# Patient Record
Sex: Male | Born: 1950
Health system: Southern US, Community
[De-identification: ages and names within clinical notes are randomized; demographics above are authoritative.]

## PROBLEM LIST (undated history)

## (undated) DIAGNOSIS — J343 Hypertrophy of nasal turbinates: Secondary | ICD-10-CM

## (undated) DIAGNOSIS — J302 Other seasonal allergic rhinitis: Secondary | ICD-10-CM

## (undated) DIAGNOSIS — N401 Enlarged prostate with lower urinary tract symptoms: Secondary | ICD-10-CM

## (undated) HISTORY — PX: NO PAST SURGERIES: SHX2092

---

## 1997-05-07 ENCOUNTER — Encounter: Admission: RE | Admit: 1997-05-07 | Discharge: 1997-05-22 | Payer: Self-pay | Admitting: Internal Medicine

## 2011-08-15 ENCOUNTER — Other Ambulatory Visit: Payer: Self-pay | Admitting: Family Medicine

## 2011-08-15 ENCOUNTER — Ambulatory Visit
Admission: RE | Admit: 2011-08-15 | Discharge: 2011-08-15 | Disposition: A | Discharge status: Home / Self Care | Payer: Managed Care, Other (non HMO) | Source: Ambulatory Visit | Attending: Family Medicine | Admitting: Family Medicine

## 2011-08-15 DIAGNOSIS — R0981 Nasal congestion: Secondary | ICD-10-CM

## 2017-09-15 DIAGNOSIS — N401 Enlarged prostate with lower urinary tract symptoms: Secondary | ICD-10-CM | POA: Diagnosis not present

## 2017-09-15 DIAGNOSIS — Z6825 Body mass index (BMI) 25.0-25.9, adult: Secondary | ICD-10-CM | POA: Diagnosis not present

## 2017-09-15 DIAGNOSIS — Z1389 Encounter for screening for other disorder: Secondary | ICD-10-CM | POA: Diagnosis not present

## 2017-09-15 DIAGNOSIS — J3489 Other specified disorders of nose and nasal sinuses: Secondary | ICD-10-CM | POA: Diagnosis not present

## 2017-10-28 DIAGNOSIS — Z23 Encounter for immunization: Secondary | ICD-10-CM | POA: Diagnosis not present

## 2018-02-28 DIAGNOSIS — J309 Allergic rhinitis, unspecified: Secondary | ICD-10-CM | POA: Diagnosis not present

## 2018-02-28 DIAGNOSIS — J3089 Other allergic rhinitis: Secondary | ICD-10-CM | POA: Diagnosis not present

## 2018-02-28 DIAGNOSIS — J339 Nasal polyp, unspecified: Secondary | ICD-10-CM | POA: Diagnosis not present

## 2018-02-28 DIAGNOSIS — J301 Allergic rhinitis due to pollen: Secondary | ICD-10-CM | POA: Diagnosis not present

## 2018-05-10 DIAGNOSIS — N401 Enlarged prostate with lower urinary tract symptoms: Secondary | ICD-10-CM | POA: Diagnosis not present

## 2018-05-10 DIAGNOSIS — Z23 Encounter for immunization: Secondary | ICD-10-CM | POA: Diagnosis not present

## 2018-05-10 DIAGNOSIS — M6530 Trigger finger, unspecified finger: Secondary | ICD-10-CM | POA: Diagnosis not present

## 2018-05-10 DIAGNOSIS — Z6826 Body mass index (BMI) 26.0-26.9, adult: Secondary | ICD-10-CM | POA: Diagnosis not present

## 2018-05-10 DIAGNOSIS — Z79899 Other long term (current) drug therapy: Secondary | ICD-10-CM | POA: Diagnosis not present

## 2018-05-10 DIAGNOSIS — Z0000 Encounter for general adult medical examination without abnormal findings: Secondary | ICD-10-CM | POA: Diagnosis not present

## 2018-05-10 DIAGNOSIS — R6882 Decreased libido: Secondary | ICD-10-CM | POA: Diagnosis not present

## 2018-05-10 DIAGNOSIS — M1990 Unspecified osteoarthritis, unspecified site: Secondary | ICD-10-CM | POA: Diagnosis not present

## 2018-07-04 DIAGNOSIS — J309 Allergic rhinitis, unspecified: Secondary | ICD-10-CM | POA: Diagnosis not present

## 2018-07-04 DIAGNOSIS — J3089 Other allergic rhinitis: Secondary | ICD-10-CM | POA: Diagnosis not present

## 2018-07-04 DIAGNOSIS — J301 Allergic rhinitis due to pollen: Secondary | ICD-10-CM | POA: Diagnosis not present

## 2018-07-04 DIAGNOSIS — J339 Nasal polyp, unspecified: Secondary | ICD-10-CM | POA: Diagnosis not present

## 2018-09-25 DIAGNOSIS — J310 Chronic rhinitis: Secondary | ICD-10-CM | POA: Diagnosis not present

## 2018-09-25 DIAGNOSIS — J342 Deviated nasal septum: Secondary | ICD-10-CM | POA: Diagnosis not present

## 2018-09-25 DIAGNOSIS — J343 Hypertrophy of nasal turbinates: Secondary | ICD-10-CM | POA: Diagnosis not present

## 2018-09-25 DIAGNOSIS — J338 Other polyp of sinus: Secondary | ICD-10-CM | POA: Diagnosis not present

## 2018-10-05 ENCOUNTER — Other Ambulatory Visit: Payer: Self-pay | Admitting: Otolaryngology

## 2018-10-05 DIAGNOSIS — J330 Polyp of nasal cavity: Secondary | ICD-10-CM

## 2018-10-31 ENCOUNTER — Other Ambulatory Visit: Payer: Self-pay | Admitting: Otolaryngology

## 2018-11-02 ENCOUNTER — Ambulatory Visit
Admission: RE | Admit: 2018-11-02 | Discharge: 2018-11-02 | Disposition: A | Discharge status: Home / Self Care | Payer: Managed Care, Other (non HMO) | Source: Ambulatory Visit | Attending: Otolaryngology | Admitting: Otolaryngology

## 2018-11-02 DIAGNOSIS — J330 Polyp of nasal cavity: Secondary | ICD-10-CM

## 2018-11-02 DIAGNOSIS — J329 Chronic sinusitis, unspecified: Secondary | ICD-10-CM | POA: Diagnosis not present

## 2018-11-07 DIAGNOSIS — J338 Other polyp of sinus: Secondary | ICD-10-CM | POA: Diagnosis not present

## 2018-11-07 DIAGNOSIS — J343 Hypertrophy of nasal turbinates: Secondary | ICD-10-CM | POA: Diagnosis not present

## 2018-11-07 DIAGNOSIS — J321 Chronic frontal sinusitis: Secondary | ICD-10-CM | POA: Diagnosis not present

## 2018-11-07 DIAGNOSIS — J322 Chronic ethmoidal sinusitis: Secondary | ICD-10-CM | POA: Diagnosis not present

## 2018-11-17 DIAGNOSIS — Z23 Encounter for immunization: Secondary | ICD-10-CM | POA: Diagnosis not present

## 2018-12-05 ENCOUNTER — Other Ambulatory Visit: Payer: Self-pay | Admitting: Otolaryngology

## 2018-12-21 ENCOUNTER — Encounter (HOSPITAL_BASED_OUTPATIENT_CLINIC_OR_DEPARTMENT_OTHER): Payer: Self-pay | Admitting: *Deleted

## 2018-12-21 ENCOUNTER — Other Ambulatory Visit: Payer: Self-pay

## 2018-12-25 ENCOUNTER — Other Ambulatory Visit (HOSPITAL_COMMUNITY)
Admission: RE | Admit: 2018-12-25 | Discharge: 2018-12-25 | Disposition: A | Discharge status: Home / Self Care | Payer: Medicare HMO | Source: Ambulatory Visit | Attending: Otolaryngology | Admitting: Otolaryngology

## 2018-12-25 DIAGNOSIS — Z01812 Encounter for preprocedural laboratory examination: Secondary | ICD-10-CM | POA: Insufficient documentation

## 2018-12-25 DIAGNOSIS — Z20828 Contact with and (suspected) exposure to other viral communicable diseases: Secondary | ICD-10-CM | POA: Insufficient documentation

## 2018-12-27 LAB — NOVEL CORONAVIRUS, NAA (HOSP ORDER, SEND-OUT TO REF LAB; TAT 18-24 HRS): SARS-CoV-2, NAA: NOT DETECTED

## 2018-12-28 ENCOUNTER — Ambulatory Visit (HOSPITAL_BASED_OUTPATIENT_CLINIC_OR_DEPARTMENT_OTHER): Payer: Medicare HMO | Admitting: Anesthesiology

## 2018-12-28 ENCOUNTER — Ambulatory Visit (HOSPITAL_BASED_OUTPATIENT_CLINIC_OR_DEPARTMENT_OTHER)
Admission: RE | Admit: 2018-12-28 | Discharge: 2018-12-28 | Disposition: A | Discharge status: Home / Self Care | Payer: Medicare HMO | Attending: Otolaryngology | Admitting: Otolaryngology

## 2018-12-28 ENCOUNTER — Encounter (HOSPITAL_BASED_OUTPATIENT_CLINIC_OR_DEPARTMENT_OTHER)
Admission: RE | Disposition: A | Discharge status: Home / Self Care | Payer: Self-pay | Source: Home / Self Care | Attending: Otolaryngology

## 2018-12-28 ENCOUNTER — Encounter (HOSPITAL_BASED_OUTPATIENT_CLINIC_OR_DEPARTMENT_OTHER): Payer: Self-pay | Admitting: Anesthesiology

## 2018-12-28 ENCOUNTER — Other Ambulatory Visit: Payer: Self-pay

## 2018-12-28 DIAGNOSIS — J3489 Other specified disorders of nose and nasal sinuses: Secondary | ICD-10-CM | POA: Diagnosis not present

## 2018-12-28 DIAGNOSIS — J343 Hypertrophy of nasal turbinates: Secondary | ICD-10-CM | POA: Diagnosis not present

## 2018-12-28 DIAGNOSIS — J320 Chronic maxillary sinusitis: Secondary | ICD-10-CM | POA: Insufficient documentation

## 2018-12-28 DIAGNOSIS — J322 Chronic ethmoidal sinusitis: Secondary | ICD-10-CM | POA: Diagnosis not present

## 2018-12-28 DIAGNOSIS — J321 Chronic frontal sinusitis: Secondary | ICD-10-CM | POA: Diagnosis not present

## 2018-12-28 DIAGNOSIS — J338 Other polyp of sinus: Secondary | ICD-10-CM | POA: Insufficient documentation

## 2018-12-28 DIAGNOSIS — J328 Other chronic sinusitis: Secondary | ICD-10-CM | POA: Diagnosis not present

## 2018-12-28 DIAGNOSIS — J330 Polyp of nasal cavity: Secondary | ICD-10-CM | POA: Diagnosis not present

## 2018-12-28 DIAGNOSIS — N400 Enlarged prostate without lower urinary tract symptoms: Secondary | ICD-10-CM | POA: Insufficient documentation

## 2018-12-28 DIAGNOSIS — J329 Chronic sinusitis, unspecified: Secondary | ICD-10-CM | POA: Diagnosis present

## 2018-12-28 HISTORY — PX: ENDOSCOPIC TURBINATE REDUCTION: SHX6489

## 2018-12-28 HISTORY — DX: Other seasonal allergic rhinitis: J30.2

## 2018-12-28 HISTORY — DX: Hypertrophy of nasal turbinates: J34.3

## 2018-12-28 HISTORY — PX: SINUS ENDO WITH FUSION: SHX5329

## 2018-12-28 HISTORY — PX: MAXILLARY ANTROSTOMY: SHX2003

## 2018-12-28 HISTORY — PX: ETHMOIDECTOMY: SHX5197

## 2018-12-28 HISTORY — DX: Benign prostatic hyperplasia with lower urinary tract symptoms: N40.1

## 2018-12-28 SURGERY — REDUCTION, NASAL TURBINATE, ENDOSCOPIC
Anesthesia: General | Site: Nose | Laterality: Bilateral

## 2018-12-28 MED ORDER — LIDOCAINE 2% (20 MG/ML) 5 ML SYRINGE
INTRAMUSCULAR | Status: AC
  Filled 2018-12-28: qty 5

## 2018-12-28 MED ORDER — LIDOCAINE 2% (20 MG/ML) 5 ML SYRINGE
INTRAMUSCULAR | Status: DC | PRN
  Administered 2018-12-28: 60 mg via INTRAVENOUS
  Administered 2018-12-28: 40 mg via INTRAVENOUS

## 2018-12-28 MED ORDER — ONDANSETRON HCL 4 MG/2ML IJ SOLN: 4.0000 mg | Freq: Once | INTRAMUSCULAR | Status: DC | PRN

## 2018-12-28 MED ORDER — OXYCODONE HCL 5 MG/5ML PO SOLN: 5.0000 mg | Freq: Once | ORAL | Status: AC | PRN

## 2018-12-28 MED ORDER — FENTANYL CITRATE (PF) 100 MCG/2ML IJ SOLN: 50.0000 ug | INTRAMUSCULAR | Status: DC | PRN

## 2018-12-28 MED ORDER — ONDANSETRON HCL 4 MG/2ML IJ SOLN
INTRAMUSCULAR | Status: AC
  Filled 2018-12-28: qty 2

## 2018-12-28 MED ORDER — MIDAZOLAM HCL 2 MG/2ML IJ SOLN
INTRAMUSCULAR | Status: AC
  Filled 2018-12-28: qty 2

## 2018-12-28 MED ORDER — CEFAZOLIN SODIUM-DEXTROSE 2-3 GM-%(50ML) IV SOLR
INTRAVENOUS | Status: DC | PRN
  Administered 2018-12-28: 2 g via INTRAVENOUS

## 2018-12-28 MED ORDER — MIDAZOLAM HCL 2 MG/2ML IJ SOLN: 1.0000 mg | INTRAMUSCULAR | Status: DC | PRN

## 2018-12-28 MED ORDER — OXYMETAZOLINE HCL 0.05 % NA SOLN
NASAL | Status: DC | PRN
  Administered 2018-12-28: 1 via TOPICAL

## 2018-12-28 MED ORDER — MUPIROCIN 2 % EX OINT
TOPICAL_OINTMENT | CUTANEOUS | Status: DC | PRN
  Administered 2018-12-28: 1 via TOPICAL

## 2018-12-28 MED ORDER — OXYCODONE-ACETAMINOPHEN 5-325 MG PO TABS: 1.0000 | ORAL_TABLET | ORAL | 0 refills | Status: AC | PRN

## 2018-12-28 MED ORDER — FENTANYL CITRATE (PF) 100 MCG/2ML IJ SOLN
INTRAMUSCULAR | Status: AC
  Filled 2018-12-28: qty 2

## 2018-12-28 MED ORDER — MIDAZOLAM HCL 5 MG/5ML IJ SOLN
INTRAMUSCULAR | Status: DC | PRN
  Administered 2018-12-28: 2 mg via INTRAVENOUS

## 2018-12-28 MED ORDER — OXYCODONE HCL 5 MG PO TABS
5.0000 mg | ORAL_TABLET | Freq: Once | ORAL | Status: AC | PRN
  Administered 2018-12-28: 5 mg via ORAL

## 2018-12-28 MED ORDER — FENTANYL CITRATE (PF) 100 MCG/2ML IJ SOLN
INTRAMUSCULAR | Status: DC | PRN
  Administered 2018-12-28: 100 ug via INTRAVENOUS

## 2018-12-28 MED ORDER — SUGAMMADEX SODIUM 200 MG/2ML IV SOLN
INTRAVENOUS | Status: DC | PRN
  Administered 2018-12-28: 200 mg via INTRAVENOUS

## 2018-12-28 MED ORDER — LACTATED RINGERS IV SOLN
INTRAVENOUS | Status: DC
  Administered 2018-12-28 (×2): via INTRAVENOUS

## 2018-12-28 MED ORDER — ROCURONIUM BROMIDE 10 MG/ML (PF) SYRINGE
PREFILLED_SYRINGE | INTRAVENOUS | Status: AC
  Filled 2018-12-28: qty 10

## 2018-12-28 MED ORDER — DEXAMETHASONE SODIUM PHOSPHATE 10 MG/ML IJ SOLN
INTRAMUSCULAR | Status: AC
  Filled 2018-12-28: qty 1

## 2018-12-28 MED ORDER — OXYCODONE HCL 5 MG PO TABS
ORAL_TABLET | ORAL | Status: AC
  Filled 2018-12-28: qty 1

## 2018-12-28 MED ORDER — CEFAZOLIN SODIUM 1 G IJ SOLR
INTRAMUSCULAR | Status: AC
  Filled 2018-12-28: qty 40

## 2018-12-28 MED ORDER — ONDANSETRON HCL 4 MG/2ML IJ SOLN
INTRAMUSCULAR | Status: DC | PRN
  Administered 2018-12-28: 4 mg via INTRAVENOUS

## 2018-12-28 MED ORDER — PHENYLEPHRINE 40 MCG/ML (10ML) SYRINGE FOR IV PUSH (FOR BLOOD PRESSURE SUPPORT)
PREFILLED_SYRINGE | INTRAVENOUS | Status: DC | PRN
  Administered 2018-12-28: 80 ug via INTRAVENOUS

## 2018-12-28 MED ORDER — AMOXICILLIN 875 MG PO TABS: 875.0000 mg | ORAL_TABLET | Freq: Two times a day (BID) | ORAL | 0 refills | Status: AC

## 2018-12-28 MED ORDER — ROCURONIUM BROMIDE 10 MG/ML (PF) SYRINGE
PREFILLED_SYRINGE | INTRAVENOUS | Status: DC | PRN
  Administered 2018-12-28: 60 mg via INTRAVENOUS
  Administered 2018-12-28: 10 mg via INTRAVENOUS

## 2018-12-28 MED ORDER — DEXAMETHASONE SODIUM PHOSPHATE 4 MG/ML IJ SOLN
INTRAMUSCULAR | Status: DC | PRN
  Administered 2018-12-28: 10 mg via INTRAVENOUS

## 2018-12-28 MED ORDER — SODIUM CHLORIDE 0.9 % IV SOLN
INTRAVENOUS | Status: AC | PRN
  Administered 2018-12-28: 100 mL

## 2018-12-28 MED ORDER — PROPOFOL 10 MG/ML IV BOLUS
INTRAVENOUS | Status: DC | PRN
  Administered 2018-12-28: 40 mg via INTRAVENOUS
  Administered 2018-12-28: 160 mg via INTRAVENOUS

## 2018-12-28 MED ORDER — FENTANYL CITRATE (PF) 100 MCG/2ML IJ SOLN: 25.0000 ug | INTRAMUSCULAR | Status: DC | PRN

## 2018-12-28 SURGICAL SUPPLY — 54 items
ATTRACTOMAT 16X20 MAGNETIC DRP (DRAPES) IMPLANT
BLADE RAD40 ROTATE 4M 4 5PK (BLADE) IMPLANT
BLADE RAD60 ROTATE M4 4 5PK (BLADE) IMPLANT
BLADE ROTATE RAD 12 4 M4 (BLADE) IMPLANT
BLADE ROTATE RAD 40 4 M4 (BLADE) IMPLANT
BLADE ROTATE TRICUT 4X13 M4 (BLADE) ×2 IMPLANT
BLADE TRICUT ROTATE M4 4 5PK (BLADE) IMPLANT
BUR HS RAD FRONTAL 3 (BURR) IMPLANT
CANISTER SUC SOCK COL 7IN (MISCELLANEOUS) ×2 IMPLANT
CANISTER SUCT 1200ML W/VALVE (MISCELLANEOUS) ×2 IMPLANT
COAGULATOR SUCT 8FR VV (MISCELLANEOUS) ×2 IMPLANT
COVER WAND RF STERILE (DRAPES) IMPLANT
DECANTER SPIKE VIAL GLASS SM (MISCELLANEOUS) IMPLANT
DRSG NASAL KENNEDY LMNT 8CM (GAUZE/BANDAGES/DRESSINGS) IMPLANT
DRSG NASOPORE 8CM (GAUZE/BANDAGES/DRESSINGS) ×2 IMPLANT
DRSG TELFA 3X8 NADH (GAUZE/BANDAGES/DRESSINGS) IMPLANT
ELECT REM PT RETURN 9FT ADLT (ELECTROSURGICAL) ×2
ELECTRODE REM PT RTRN 9FT ADLT (ELECTROSURGICAL) ×1 IMPLANT
GLOVE BIO SURGEON STRL SZ7 (GLOVE) ×2 IMPLANT
GLOVE BIO SURGEON STRL SZ7.5 (GLOVE) ×2 IMPLANT
GLOVE BIOGEL PI IND STRL 7.0 (GLOVE) ×1 IMPLANT
GLOVE BIOGEL PI INDICATOR 7.0 (GLOVE) ×1
GLOVE ECLIPSE 6.5 STRL STRAW (GLOVE) ×2 IMPLANT
GOWN STRL REUS W/ TWL LRG LVL3 (GOWN DISPOSABLE) ×2 IMPLANT
GOWN STRL REUS W/TWL LRG LVL3 (GOWN DISPOSABLE) ×4
HEMOSTAT SURGICEL 2X14 (HEMOSTASIS) IMPLANT
IV NS 1000ML (IV SOLUTION)
IV NS 1000ML BAXH (IV SOLUTION) IMPLANT
IV NS 500ML (IV SOLUTION) ×4
IV NS 500ML BAXH (IV SOLUTION) ×2 IMPLANT
NEEDLE HYPO 25X1 1.5 SAFETY (NEEDLE) ×2 IMPLANT
NEEDLE SPNL 25GX3.5 QUINCKE BL (NEEDLE) IMPLANT
NS IRRIG 1000ML POUR BTL (IV SOLUTION) ×2 IMPLANT
PACK BASIN DAY SURGERY FS (CUSTOM PROCEDURE TRAY) ×2 IMPLANT
PACK ENT DAY SURGERY (CUSTOM PROCEDURE TRAY) ×2 IMPLANT
PATTIES SURGICAL .5 X3 (DISPOSABLE) IMPLANT
SLEEVE SCD COMPRESS KNEE MED (MISCELLANEOUS) ×2 IMPLANT
SOLUTION BUTLER CLEAR DIP (MISCELLANEOUS) ×2 IMPLANT
SPLINT NASAL AIRWAY SILICONE (MISCELLANEOUS) IMPLANT
SPONGE GAUZE 2X2 8PLY STRL LF (GAUZE/BANDAGES/DRESSINGS) ×2 IMPLANT
SPONGE NEURO XRAY DETECT 1X3 (DISPOSABLE) ×2 IMPLANT
SUCTION FRAZIER HANDLE 10FR (MISCELLANEOUS)
SUCTION TUBE FRAZIER 10FR DISP (MISCELLANEOUS) IMPLANT
SUT CHROMIC 4 0 P 3 18 (SUTURE) IMPLANT
SUT PLAIN 4 0 ~~LOC~~ 1 (SUTURE) IMPLANT
SUT PROLENE 3 0 PS 2 (SUTURE) IMPLANT
SYR 50ML LL SCALE MARK (SYRINGE) ×2 IMPLANT
TOWEL GREEN STERILE FF (TOWEL DISPOSABLE) ×2 IMPLANT
TRACKER ENT INSTRUMENT (MISCELLANEOUS) ×2 IMPLANT
TRACKER ENT PATIENT (MISCELLANEOUS) ×2 IMPLANT
TUBE CONNECTING 20X1/4 (TUBING) ×2 IMPLANT
TUBE SALEM SUMP 16 FR W/ARV (TUBING) IMPLANT
TUBING STRAIGHTSHOT EPS 5PK (TUBING) ×2 IMPLANT
YANKAUER SUCT BULB TIP NO VENT (SUCTIONS) ×2 IMPLANT

## 2018-12-28 NOTE — Transfer of Care (Signed)
Immediate Anesthesia Transfer of Care Note  Patient: Mark Gilbert  Procedure(s) Performed: ENDOSCOPIC BILATERAL TURBINATE REDUCTION (Bilateral Nose) BILATERAL MAXILLARY ANTROSTOMY WITH TISSUE REMOVAL (Bilateral Nose) BILATERAL ETHMOIDECTOMY AND FRONTAL RECESS (Bilateral Nose) SINUS ENDOSCOPY WITH FUSION NAVIGATION (Bilateral Nose)  Patient Location: PACU  Anesthesia Type:General  Level of Consciousness: awake  Airway & Oxygen Therapy: Patient Spontanous Breathing and Patient connected to face mask oxygen  Post-op Assessment: Report given to RN and Post -op Vital signs reviewed and stable  Post vital signs: Reviewed and stable  Last Vitals:  Vitals Value Taken Time  BP 124/64 12/28/18 1234  Temp    Pulse 85 12/28/18 1236  Resp 16 12/28/18 1236  SpO2 97 % 12/28/18 1236  Vitals shown include unvalidated device data.  Last Pain:  Vitals:   12/28/18 0901  TempSrc: Oral  PainSc: 0-No pain      Patients Stated Pain Goal: 3 (74/08/14 4818)  Complications: No apparent anesthesia complications

## 2018-12-28 NOTE — Discharge Instructions (Signed)
Post Anesthesia Home Care Instructions  Activity: Get plenty of rest for the remainder of the day. A responsible individual must stay with you for 24 hours following the procedure.  For the next 24 hours, DO NOT: -Drive a car -Operate machinery -Drink alcoholic beverages -Take any medication unless instructed by your physician -Make any legal decisions or sign important papers.  Meals: Start with liquid foods such as gelatin or soup. Progress to regular foods as tolerated. Avoid greasy, spicy, heavy foods. If nausea and/or vomiting occur, drink only clear liquids until the nausea and/or vomiting subsides. Call your physician if vomiting continues.  Special Instructions/Symptoms: Your throat may feel dry or sore from the anesthesia or the breathing tube placed in your throat during surgery. If this causes discomfort, gargle with warm salt water. The discomfort should disappear within 24 hours.  If you had a scopolamine patch placed behind your ear for the management of post- operative nausea and/or vomiting:  1. The medication in the patch is effective for 72 hours, after which it should be removed.  Wrap patch in a tissue and discard in the trash. Wash hands thoroughly with soap and water. 2. You may remove the patch earlier than 72 hours if you experience unpleasant side effects which may include dry mouth, dizziness or visual disturbances. 3. Avoid touching the patch. Wash your hands with soap and water after contact with the patch.    -----------------  POSTOPERATIVE INSTRUCTIONS FOR PATIENTS HAVING NASAL OR SINUS OPERATIONS ACTIVITY: Restrict activity at home for the first two days, resting as much as possible. Light activity is best. You may usually return to work within a week. You should refrain from nose blowing, strenuous activity, or heavy lifting greater than 20lbs for a total of one week after your operation.  If sneezing cannot be avoided, sneeze with your mouth  open. DISCOMFORT: You may experience a dull headache and pressure along with nasal congestion and discharge. These symptoms may be worse during the first week after the operation but may last as long as two to four weeks.  Please take Tylenol or the pain medication that has been prescribed for you. Do not take aspirin or aspirin containing medications since they may cause bleeding.  You may experience symptoms of post nasal drainage, nasal congestion, headaches and fatigue for two or three months after your operation.  BLEEDING: You may have some blood tinged nasal drainage for approximately two weeks after the operation.  The discharge will be worse for the first week.  Please call our office at [phone] or go to the nearest hospital emergency room if you experience any of the following: heavy, bright red blood from your nose or mouth that lasts longer than 15 minutes or coughing up or vomiting bright red blood or blood clots. GENERAL CONSIDERATIONS: 1. A gauze dressing will be placed on your upper lip to absorb any drainage after the operation. You may need to change this several times a day.  If you do not have very much drainage, you may remove the dressing.  Remember that you may gently wipe your nose with a tissue and sniff in, but DO NOT blow your nose. 2. Please keep all of your postoperative appointments.  Your final results after the operation will depend on proper follow-up.  The initial visit is usually 2 to 5 days after the operation.  During this visit, the remaining nasal packing and internal septal splints will be removed.  Your nasal and sinus cavities will   be cleaned.  During the second visit, your nasal and sinus cavities will be cleaned again. Have someone drive you to your first two postoperative appointments.  3. How you care for your nose after the operation will influence the results that you obtain.  You should follow all directions, take your medication as prescribed, and call  our office [phone] with any problems or questions. 4. You may be more comfortable sleeping with your head elevated on two pillows. 5. Do not take any medications that we have not prescribed or recommended. WARNING SIGNS: if any of the following should occur, please call our office: 1. Persistent fever greater than 102F. 2. Persistent vomiting. 3. Severe and constant pain that is not relieved by prescribed pain medication. 4. Trauma to the nose. 5. Rash or unusual side effects from any medicines.  

## 2018-12-28 NOTE — Anesthesia Preprocedure Evaluation (Addendum)
Anesthesia Evaluation  Patient identified by MRN, date of birth, ID band Patient awake    Reviewed: Allergy & Precautions, NPO status , Patient's Chart, lab work & pertinent test results  History of Anesthesia Complications Negative for: history of anesthetic complications  Airway Mallampati: I  TM Distance: >3 FB Neck ROM: Full    Dental  (+) Dental Advisory Given, Missing, Poor Dentition   Pulmonary neg pulmonary ROS,    Pulmonary exam normal        Cardiovascular (-) anginanegative cardio ROS Normal cardiovascular exam     Neuro/Psych negative neurological ROS  negative psych ROS   GI/Hepatic negative GI ROS, Neg liver ROS,   Endo/Other  negative endocrine ROS  Renal/GU negative Renal ROS    BPH     Musculoskeletal negative musculoskeletal ROS (+)   Abdominal   Peds  Hematology negative hematology ROS (+)   Anesthesia Other Findings   Reproductive/Obstetrics                            Anesthesia Physical Anesthesia Plan  ASA: I  Anesthesia Plan: General   Post-op Pain Management:    Induction: Intravenous  PONV Risk Score and Plan: 3 and Treatment may vary due to age or medical condition, Ondansetron, Midazolam and Dexamethasone  Airway Management Planned: Oral ETT  Additional Equipment: None  Intra-op Plan:   Post-operative Plan: Extubation in OR  Informed Consent: I have reviewed the patients History and Physical, chart, labs and discussed the procedure including the risks, benefits and alternatives for the proposed anesthesia with the patient or authorized representative who has indicated his/her understanding and acceptance.     Dental advisory given  Plan Discussed with: CRNA and Anesthesiologist  Anesthesia Plan Comments:        Anesthesia Quick Evaluation

## 2018-12-28 NOTE — H&P (Signed)
Cc: Chronic nasal obstruction, sinonasal polyps  HPI: The patient is a 68 year old male who returns today for his follow-up evaluation. The patient was last seen 1 month ago.  At that time, he was complaining of chronic nasal obstruction. He was noted to have bilateral sinonasal polyps, causing significant obstruction of his nasal cavities.  He was also noted to have significant nasal mucosal congestion and bilateral inferior turbinate hypertrophy.  The patient was treated with 12 days of high dose prednisone and Flonase nasal spray.  The patient subsequently underwent a sinus CT scan. The CT showed polypoid tissue within the maxillary and ethmoid sinuses, causing occlusion of bilateral osteomeatal complexes.  In addition, approximately 6 mm of mucosal thickening was also noted within his frontal recesses.  The patient returns today complaining of persistent nasal obstruction. The severity has slightly improved after the high dose prednisone.  Currently he denies any facial pain or fever.   Exam: The nasal cavities were decongested and anesthetised with a combination of oxymetazoline and 4% lidocaine solution.  The flexible scope was inserted into the right nasal cavity. The patient's nasal cavity was obstructed by multiple sinonasal polyps. Endoscopy of the inferior and middle meatus was performed. The middle meatus was obstructed by polypoid tissue. Nasopharynx was clear.  Turbinates were hypertrophied but without mass.  The procedure was repeated on the contralateral side with similar findings.  The patient tolerated the procedure well.  Instructions were given to avoid eating or drinking for 2 hours.   Assessment: 1.  Persistent sinonasal polyps, affecting maxillary sinuses, ethmoid sinuses, and frontal recesses.  2.  Bilateral severe inferior turbinate hypertrophy, causing significant nasal obstruction bilaterally.   3.  No purulent drainage is noted today.   Plan: 1.  The nasal endoscopy findings  and the CT images are reviewed with the patient.  2.  The patient should continue with his Flonase nasal spray daily.   3.  Based on the above findings, the patient will benefit from undergoing surgical intervention with bilateral turbinate reduction and endoscopic sinus surgery to remove his sinonasal polyps within the maxillary and frontal sinuses as well as the frontal recesses bilaterally.  The risks, benefits, alternatives and details of the procedures are reviewed with the patient.  Questions are invited and answered.  4.  The patient would like to proceed with the procedures.

## 2018-12-28 NOTE — Op Note (Signed)
DATE OF PROCEDURE: 12/28/2018  OPERATIVE REPORT   SURGEON: Leta Baptist, MD   PREOPERATIVE DIAGNOSES:  1. Bilateral chronic maxillary, ethmoid, frontal sinusitis and polyposis. 2. Bilateral inferior turbinate hypertrophy.  3. Chronic nasal obstruction.  POSTOPERATIVE DIAGNOSES:  1. Bilateral chronic maxillary, ethmoid, frontal sinusitis and polyposis. 2. Bilateral inferior turbinate hypertrophy.  3. Chronic nasal obstruction.  PROCEDURE PERFORMED:  1. Bilateral endoscopic frontal sinusotomy and total ethmoidectomy. 2. Bilateral endoscopic maxillary antrostomy with polyp removal. 3. Bilateral partial inferior turbinate resection.  4. FUSION stereotactic image guidance.  ANESTHESIA: General endotracheal tube anesthesia.   COMPLICATIONS: None.   ESTIMATED BLOOD LOSS: 400 mL.   INDICATION FOR PROCEDURE: Mark Gilbert is a 68 y.o. male with a history of chronic nasal obstruction, sinonasal polyps, and chronic rhinosinusitis. The patient was  treated with antibiotics, antihistamine, decongestant, steroid nasal spray, and systemic steroids. However, the patient continued to be symptomatic. On examination, the patient was noted to have bilateral severe inferior turbinate hypertrophy and a large amount of sinonasal polyps.  His CT scan showed polypoid tissue within the maxillary and ethmoid sinuses, causing occlusion of bilateral ostiomeatal complexes.  In addition, he also has significant mucosal thickening within his frontal recesses.  Based on the above findings, the decision was made for the patient to undergo the above-stated procedures. The risks, benefits, alternatives, and details of the procedures were discussed with the patient. Questions were invited and answered. Informed consent was obtained.   DESCRIPTION OF PROCEDURE: The patient was taken to the operating room and placed supine on the operating table. General endotracheal tube anesthesia was administered by the anesthesiologist. The  patient was positioned, and prepped and draped in the standard fashion for nasal surgery. Pledgets soaked with Afrin were placed in both nasal cavities for decongestion. The pledgets were subsequently removed. The FUSION stereotactic image guidance marker was placed. The image guidance system was functional throughout the case.  The inferior one half of both hypertrophied inferior turbinate was crossclamped with a Kelly clamp. The inferior one half of each inferior turbinate was then resected with a pair of cross cutting scissors. Hemostasis was achieved with a suction cautery device.   Using a 0 endoscope, the left nasal cavity was examined.  The middle turbinate was carefully medialized. Polypoid tissue was noted within the middle meatus. The polypoid tissue was removed using a combination of microdebrider and Blakesley forceps. The uncinate process was resected with a freer elevator. The maxillary antrum was entered and enlarged using a combination of backbiter and microdebrider. Polypoid tissue was removed from the left maxillary sinus.  Attention was then focused on the ethmoid sinuses. The bony partitions of the anterior and posterior ethmoid cavities were taken down. Polypoid tissue was noted and removed.  Attention was then focused on the frontal sinus. The frontal recess was identified and enlarged by removing the surrounding bony partitions. Polypoid tissue was removed from the frontal recess. All paranasal sinuses were copiously irrigated with saline solution.  The same procedure was repeated on the right side without exception. More polyps were noted on the right side. All polyps were removed. Doyle splints were applied to the nasal septum.  The care of the patient was turned over to the anesthesiologist. The patient was awakened from anesthesia without difficulty. The patient was extubated and transferred to the recovery room in good condition.   OPERATIVE FINDINGS: Bilateral sinonasal polyps  and bilateral inferior turbinate hypertrophy.   SPECIMEN: Bilateral sinus contents.  FOLLOWUP CARE: The patient be discharged  home once he is awake and alert. The patient will be placed on Percocet 1 tablets p.o. q.4 hours p.r.n. pain, and amoxicillin 875 mg p.o. b.i.d. for 5 days. The patient will follow up in my office in approximately 1 week.  Scotti Motter Philomena Doheny, MD

## 2018-12-28 NOTE — Anesthesia Procedure Notes (Signed)
Procedure Name: Intubation Date/Time: 12/28/2018 10:39 AM Performed by: Lieutenant Diego, CRNA Pre-anesthesia Checklist: Patient identified, Emergency Drugs available, Suction available and Patient being monitored Patient Re-evaluated:Patient Re-evaluated prior to induction Oxygen Delivery Method: Circle system utilized Preoxygenation: Pre-oxygenation with 100% oxygen Induction Type: IV induction Ventilation: Mask ventilation without difficulty Laryngoscope Size: Miller and 2 Grade View: Grade I Tube type: Oral Tube size: 8.0 mm Number of attempts: 1 Airway Equipment and Method: Stylet and Oral airway Placement Confirmation: ETT inserted through vocal cords under direct vision,  positive ETCO2 and breath sounds checked- equal and bilateral Secured at: 23 cm Tube secured with: Tape Dental Injury: Teeth and Oropharynx as per pre-operative assessment

## 2018-12-28 NOTE — Anesthesia Postprocedure Evaluation (Signed)
Anesthesia Post Note  Patient: Mark Gilbert  Procedure(s) Performed: ENDOSCOPIC BILATERAL TURBINATE REDUCTION (Bilateral Nose) BILATERAL MAXILLARY ANTROSTOMY WITH TISSUE REMOVAL (Bilateral Nose) BILATERAL ETHMOIDECTOMY AND FRONTAL RECESS (Bilateral Nose) SINUS ENDOSCOPY WITH FUSION NAVIGATION (Bilateral Nose)     Patient location during evaluation: PACU Anesthesia Type: General Level of consciousness: awake and alert Pain management: pain level controlled Vital Signs Assessment: post-procedure vital signs reviewed and stable Respiratory status: spontaneous breathing, nonlabored ventilation and respiratory function stable Cardiovascular status: blood pressure returned to baseline and stable Postop Assessment: no apparent nausea or vomiting Anesthetic complications: no    Last Vitals:  Vitals:   12/28/18 1257 12/28/18 1337  BP: 129/79 125/81  Pulse: 83 77  Resp: 14 18  Temp:  36.7 C  SpO2: 95% 95%    Last Pain:  Vitals:   12/28/18 1337  TempSrc: Oral  PainSc: [place]

## 2018-12-31 ENCOUNTER — Encounter (HOSPITAL_BASED_OUTPATIENT_CLINIC_OR_DEPARTMENT_OTHER): Payer: Self-pay | Admitting: Otolaryngology

## 2018-12-31 LAB — SURGICAL PATHOLOGY

## 2019-01-02 DIAGNOSIS — J338 Other polyp of sinus: Secondary | ICD-10-CM | POA: Diagnosis not present

## 2019-01-02 DIAGNOSIS — J322 Chronic ethmoidal sinusitis: Secondary | ICD-10-CM | POA: Diagnosis not present

## 2019-01-02 DIAGNOSIS — J321 Chronic frontal sinusitis: Secondary | ICD-10-CM | POA: Diagnosis not present

## 2019-01-02 DIAGNOSIS — J320 Chronic maxillary sinusitis: Secondary | ICD-10-CM | POA: Diagnosis not present

## 2019-01-18 DIAGNOSIS — J339 Nasal polyp, unspecified: Secondary | ICD-10-CM | POA: Diagnosis not present

## 2019-01-18 DIAGNOSIS — J3089 Other allergic rhinitis: Secondary | ICD-10-CM | POA: Diagnosis not present

## 2019-01-18 DIAGNOSIS — J301 Allergic rhinitis due to pollen: Secondary | ICD-10-CM | POA: Diagnosis not present

## 2019-01-18 DIAGNOSIS — J309 Allergic rhinitis, unspecified: Secondary | ICD-10-CM | POA: Diagnosis not present

## 2019-01-22 DIAGNOSIS — J338 Other polyp of sinus: Secondary | ICD-10-CM | POA: Diagnosis not present

## 2019-01-22 DIAGNOSIS — J320 Chronic maxillary sinusitis: Secondary | ICD-10-CM | POA: Diagnosis not present

## 2019-01-22 DIAGNOSIS — J322 Chronic ethmoidal sinusitis: Secondary | ICD-10-CM | POA: Diagnosis not present

## 2019-01-22 DIAGNOSIS — J321 Chronic frontal sinusitis: Secondary | ICD-10-CM | POA: Diagnosis not present

## 2019-02-26 DIAGNOSIS — J338 Other polyp of sinus: Secondary | ICD-10-CM | POA: Diagnosis not present

## 2019-02-26 DIAGNOSIS — J322 Chronic ethmoidal sinusitis: Secondary | ICD-10-CM | POA: Diagnosis not present

## 2019-02-26 DIAGNOSIS — J320 Chronic maxillary sinusitis: Secondary | ICD-10-CM | POA: Diagnosis not present

## 2019-02-26 DIAGNOSIS — J321 Chronic frontal sinusitis: Secondary | ICD-10-CM | POA: Diagnosis not present

## 2019-05-27 DIAGNOSIS — Z0000 Encounter for general adult medical examination without abnormal findings: Secondary | ICD-10-CM | POA: Diagnosis not present

## 2019-05-27 DIAGNOSIS — R7989 Other specified abnormal findings of blood chemistry: Secondary | ICD-10-CM | POA: Diagnosis not present

## 2019-05-27 DIAGNOSIS — Z125 Encounter for screening for malignant neoplasm of prostate: Secondary | ICD-10-CM | POA: Diagnosis not present

## 2019-06-03 DIAGNOSIS — R82998 Other abnormal findings in urine: Secondary | ICD-10-CM | POA: Diagnosis not present

## 2019-06-03 DIAGNOSIS — Z0000 Encounter for general adult medical examination without abnormal findings: Secondary | ICD-10-CM | POA: Diagnosis not present

## 2019-06-03 DIAGNOSIS — M79601 Pain in right arm: Secondary | ICD-10-CM | POA: Diagnosis not present

## 2019-06-03 DIAGNOSIS — R05 Cough: Secondary | ICD-10-CM | POA: Diagnosis not present

## 2019-06-03 DIAGNOSIS — N401 Enlarged prostate with lower urinary tract symptoms: Secondary | ICD-10-CM | POA: Diagnosis not present

## 2019-07-12 DIAGNOSIS — N401 Enlarged prostate with lower urinary tract symptoms: Secondary | ICD-10-CM | POA: Diagnosis not present

## 2019-07-12 DIAGNOSIS — R351 Nocturia: Secondary | ICD-10-CM | POA: Diagnosis not present

## 2019-07-12 DIAGNOSIS — R972 Elevated prostate specific antigen [PSA]: Secondary | ICD-10-CM | POA: Diagnosis not present

## 2019-10-03 DIAGNOSIS — J324 Chronic pansinusitis: Secondary | ICD-10-CM | POA: Diagnosis not present

## 2019-10-03 DIAGNOSIS — J338 Other polyp of sinus: Secondary | ICD-10-CM | POA: Diagnosis not present

## 2019-10-03 DIAGNOSIS — J310 Chronic rhinitis: Secondary | ICD-10-CM | POA: Diagnosis not present

## 2019-11-23 DIAGNOSIS — Z23 Encounter for immunization: Secondary | ICD-10-CM | POA: Diagnosis not present

## 2019-11-30 ENCOUNTER — Ambulatory Visit: Payer: Medicare HMO | Attending: Internal Medicine

## 2019-11-30 DIAGNOSIS — Z23 Encounter for immunization: Secondary | ICD-10-CM

## 2019-11-30 NOTE — Progress Notes (Signed)
   Covid-19 Vaccination Clinic  Name:  [name]    MRN: [id number] DOB: [birth year]  11/30/2019  Mr. Glotfelty was observed post Covid-19 immunization for 15 minutes without incident. He was provided with Vaccine Information Sheet and instruction to access the V-Safe system.   Mr. Netzel was instructed to call 911 with any severe reactions post vaccine: Marland Kitchen Difficulty breathing  . Swelling of face and throat  . A fast heartbeat  . A bad rash all over body  . Dizziness and weakness

## 2019-12-20 DIAGNOSIS — R972 Elevated prostate specific antigen [PSA]: Secondary | ICD-10-CM | POA: Diagnosis not present

## 2020-02-03 DIAGNOSIS — N3000 Acute cystitis without hematuria: Secondary | ICD-10-CM | POA: Diagnosis not present

## 2020-02-03 DIAGNOSIS — R8271 Bacteriuria: Secondary | ICD-10-CM | POA: Diagnosis not present

## 2020-02-06 DIAGNOSIS — R972 Elevated prostate specific antigen [PSA]: Secondary | ICD-10-CM | POA: Diagnosis not present

## 2020-02-06 DIAGNOSIS — N3000 Acute cystitis without hematuria: Secondary | ICD-10-CM | POA: Diagnosis not present

## 2020-02-06 DIAGNOSIS — N401 Enlarged prostate with lower urinary tract symptoms: Secondary | ICD-10-CM | POA: Diagnosis not present

## 2020-03-12 DIAGNOSIS — R351 Nocturia: Secondary | ICD-10-CM | POA: Diagnosis not present

## 2020-03-12 DIAGNOSIS — N401 Enlarged prostate with lower urinary tract symptoms: Secondary | ICD-10-CM | POA: Diagnosis not present

## 2020-03-12 DIAGNOSIS — R972 Elevated prostate specific antigen [PSA]: Secondary | ICD-10-CM | POA: Diagnosis not present

## 2020-03-12 DIAGNOSIS — N3000 Acute cystitis without hematuria: Secondary | ICD-10-CM | POA: Diagnosis not present

## 2020-06-02 DIAGNOSIS — Z0000 Encounter for general adult medical examination without abnormal findings: Secondary | ICD-10-CM | POA: Diagnosis not present

## 2020-06-02 DIAGNOSIS — R7989 Other specified abnormal findings of blood chemistry: Secondary | ICD-10-CM | POA: Diagnosis not present

## 2020-06-02 DIAGNOSIS — Z125 Encounter for screening for malignant neoplasm of prostate: Secondary | ICD-10-CM | POA: Diagnosis not present

## 2020-06-09 DIAGNOSIS — Z1339 Encounter for screening examination for other mental health and behavioral disorders: Secondary | ICD-10-CM | POA: Diagnosis not present

## 2020-06-09 DIAGNOSIS — R82998 Other abnormal findings in urine: Secondary | ICD-10-CM | POA: Diagnosis not present

## 2020-06-09 DIAGNOSIS — Z13828 Encounter for screening for other musculoskeletal disorder: Secondary | ICD-10-CM | POA: Diagnosis not present

## 2020-06-09 DIAGNOSIS — R972 Elevated prostate specific antigen [PSA]: Secondary | ICD-10-CM | POA: Diagnosis not present

## 2020-06-09 DIAGNOSIS — Z0000 Encounter for general adult medical examination without abnormal findings: Secondary | ICD-10-CM | POA: Diagnosis not present

## 2020-06-09 DIAGNOSIS — R0981 Nasal congestion: Secondary | ICD-10-CM | POA: Diagnosis not present

## 2020-06-09 DIAGNOSIS — Z1331 Encounter for screening for depression: Secondary | ICD-10-CM | POA: Diagnosis not present

## 2020-06-09 DIAGNOSIS — N401 Enlarged prostate with lower urinary tract symptoms: Secondary | ICD-10-CM | POA: Diagnosis not present

## 2020-06-09 DIAGNOSIS — R6882 Decreased libido: Secondary | ICD-10-CM | POA: Diagnosis not present

## 2020-07-03 DIAGNOSIS — R972 Elevated prostate specific antigen [PSA]: Secondary | ICD-10-CM | POA: Diagnosis not present

## 2020-07-10 DIAGNOSIS — N400 Enlarged prostate without lower urinary tract symptoms: Secondary | ICD-10-CM | POA: Diagnosis not present

## 2020-07-10 DIAGNOSIS — R972 Elevated prostate specific antigen [PSA]: Secondary | ICD-10-CM | POA: Diagnosis not present

## 2020-07-16 ENCOUNTER — Other Ambulatory Visit: Payer: Self-pay | Admitting: Urology

## 2020-07-16 DIAGNOSIS — R972 Elevated prostate specific antigen [PSA]: Secondary | ICD-10-CM

## 2020-08-14 ENCOUNTER — Ambulatory Visit
Admission: RE | Admit: 2020-08-14 | Discharge: 2020-08-14 | Disposition: A | Discharge status: Home / Self Care | Payer: Medicare HMO | Source: Ambulatory Visit | Attending: Urology | Admitting: Urology

## 2020-08-14 ENCOUNTER — Other Ambulatory Visit: Payer: Self-pay

## 2020-08-14 DIAGNOSIS — R972 Elevated prostate specific antigen [PSA]: Secondary | ICD-10-CM

## 2020-08-14 DIAGNOSIS — N3289 Other specified disorders of bladder: Secondary | ICD-10-CM | POA: Diagnosis not present

## 2020-08-14 DIAGNOSIS — N402 Nodular prostate without lower urinary tract symptoms: Secondary | ICD-10-CM | POA: Diagnosis not present

## 2020-08-14 DIAGNOSIS — K5730 Diverticulosis of large intestine without perforation or abscess without bleeding: Secondary | ICD-10-CM | POA: Diagnosis not present

## 2020-08-14 MED ORDER — GADOBENATE DIMEGLUMINE 529 MG/ML IV SOLN
10.0000 mL | Freq: Once | INTRAVENOUS | Status: AC | PRN
  Administered 2020-08-14: 10 mL via INTRAVENOUS

## 2020-08-24 DIAGNOSIS — N400 Enlarged prostate without lower urinary tract symptoms: Secondary | ICD-10-CM | POA: Diagnosis not present

## 2020-08-24 DIAGNOSIS — R972 Elevated prostate specific antigen [PSA]: Secondary | ICD-10-CM | POA: Diagnosis not present

## 2020-11-21 DIAGNOSIS — Z23 Encounter for immunization: Secondary | ICD-10-CM | POA: Diagnosis not present

## 2021-01-14 ENCOUNTER — Other Ambulatory Visit: Payer: Self-pay

## 2021-01-14 ENCOUNTER — Encounter (HOSPITAL_COMMUNITY): Payer: Self-pay

## 2021-01-14 ENCOUNTER — Emergency Department (HOSPITAL_COMMUNITY): Payer: Medicare HMO

## 2021-01-14 ENCOUNTER — Emergency Department (HOSPITAL_COMMUNITY)
Admission: EM | Admit: 2021-01-14 | Discharge: 2021-01-14 | Disposition: A | Discharge status: Home / Self Care | Payer: Medicare HMO | Attending: Emergency Medicine | Admitting: Emergency Medicine

## 2021-01-14 DIAGNOSIS — M79605 Pain in left leg: Secondary | ICD-10-CM | POA: Insufficient documentation

## 2021-01-14 DIAGNOSIS — Y990 Civilian activity done for income or pay: Secondary | ICD-10-CM | POA: Insufficient documentation

## 2021-01-14 DIAGNOSIS — X501XXA Overexertion from prolonged static or awkward postures, initial encounter: Secondary | ICD-10-CM | POA: Insufficient documentation

## 2021-01-14 DIAGNOSIS — M545 Low back pain, unspecified: Secondary | ICD-10-CM | POA: Diagnosis not present

## 2021-01-14 DIAGNOSIS — M25552 Pain in left hip: Secondary | ICD-10-CM | POA: Diagnosis not present

## 2021-01-14 DIAGNOSIS — M549 Dorsalgia, unspecified: Secondary | ICD-10-CM | POA: Diagnosis not present

## 2021-01-14 DIAGNOSIS — M25551 Pain in right hip: Secondary | ICD-10-CM | POA: Diagnosis not present

## 2021-01-14 MED ORDER — METHOCARBAMOL 500 MG PO TABS: 500.0000 mg | ORAL_TABLET | Freq: Two times a day (BID) | ORAL | 0 refills | Status: AC

## 2021-01-14 NOTE — ED Triage Notes (Signed)
Pt states that he woke up with pain in his L leg that shoots from his hip all the way down.

## 2021-01-14 NOTE — ED Provider Notes (Signed)
[facility] EMERGENCY DEPARTMENT Provider Note   CSN: [id number] Arrival date & time: 01/14/21  3762     History Chief Complaint  Patient presents with   Sciatica    Mark Gilbert is a 70 y.o. male.  Patient with no pertinent past medical history presents today with complaint of right hip and leg pain. States that symptoms have been present for approximately 1 week. 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 States that the pain is localized to his left hip and thigh area and feels like a muscle spasm that moves down his left leg. Is intermittent in nature and only present occasionally when ambulating. He denies fevers, chills, history of malignancy or long term steroid use. No IVDU or loss of bowel or bladder function. No back pain. He is ambulatory without difficulty.  The history is provided by the patient. No language interpreter was used.      Past Medical History:  Diagnosis Date   BPH associated with nocturia    Hypertrophy of nasal turbinates    Seasonal allergies     There are no problems to display for this patient.   Past Surgical History:  Procedure Laterality Date   ENDOSCOPIC TURBINATE REDUCTION Bilateral 12/28/2018   Procedure: ENDOSCOPIC BILATERAL TURBINATE REDUCTION;  Surgeon: Newman Pies, MD;  Location: [facility];  Service: ENT;  Laterality: Bilateral;   ETHMOIDECTOMY Bilateral 12/28/2018   Procedure: BILATERAL ETHMOIDECTOMY AND FRONTAL RECESS;  Surgeon: Newman Pies, MD;  Location: [facility];  Service: ENT;  Laterality: Bilateral;   MAXILLARY ANTROSTOMY Bilateral 12/28/2018   Procedure: BILATERAL MAXILLARY ANTROSTOMY WITH TISSUE REMOVAL;  Surgeon: Newman Pies, MD;  Location: [facility];  Service: ENT;  Laterality: Bilateral;   NO PAST SURGERIES     SINUS ENDO WITH FUSION Bilateral 12/28/2018   Procedure: SINUS  ENDOSCOPY WITH FUSION NAVIGATION;  Surgeon: Newman Pies, MD;  Location: [facility];  Service: ENT;  Laterality: Bilateral;       No family history on file.  Social History   Tobacco Use   Smoking status: Never   Smokeless tobacco: Never  Substance Use Topics   Alcohol use: Never   Drug use: Never    Home Medications Prior to Admission medications   Medication Sig Start Date End Date Taking? Authorizing Provider  tamsulosin (FLOMAX) 0.4 MG CAPS capsule Take 0.4 mg by mouth daily.    [provider]    Allergies    Patient has no known allergies.  Review of Systems   Review of Systems  Constitutional:  Negative for chills and fever.  Gastrointestinal:  Negative for diarrhea, nausea and vomiting.  Genitourinary:  Negative for difficulty urinating.  Musculoskeletal:  Positive for myalgias. Negative for back pain and gait problem.  Neurological:  Negative for dizziness, tremors, seizures, syncope, facial asymmetry, speech difficulty, weakness, light-headedness, numbness and headaches.  Psychiatric/Behavioral:  Negative for confusion and decreased concentration.   All other systems reviewed and are negative.  Physical Exam Updated Vital Signs BP 133/75 (BP Location: Right Arm)   Pulse 64   Temp 98.2 F (36.8 C)   Resp 16   SpO2 99%   Physical Exam Vitals and nursing note reviewed.  Constitutional:      Appearance: Normal appearance. He is normal weight.     Comments: Patient resting comfortably in bed in no acute  distress  HENT:     Head: Normocephalic and atraumatic.  Cardiovascular:     Rate and Rhythm: Normal rate and regular rhythm.     Heart sounds: Normal heart sounds.  Pulmonary:     Effort: Pulmonary effort is normal. No respiratory distress.     Breath sounds: Normal breath sounds.  Abdominal:     General: Abdomen is flat.     Palpations: Abdomen is soft.  Musculoskeletal:     Cervical back: Normal range of motion. No tenderness.      Comments: Patient with 5/5 strength in bilateral lower legs. Full painless ROM. Straight leg raise negative. No tenderness to palpation of the cervical, thoracic, lumbar spine or left hip or thigh. No warmth, swelling, or deformity noted. Distal pulses and sensation intact.  Skin:    General: Skin is warm and dry.  Neurological:     General: No focal deficit present.     Mental Status: He is alert.  Psychiatric:        Mood and Affect: Mood normal.        Behavior: Behavior normal.    ED Results / Procedures / Treatments   Labs (all labs ordered are listed, but only abnormal results are displayed) Labs Reviewed - No data to display  EKG None  Radiology DG Lumbar Spine Complete  Result Date: 01/14/2021 CLINICAL DATA:  Pain. EXAM: LUMBAR SPINE - COMPLETE 4+ VIEW COMPARISON:  None. FINDINGS: No fracture or subluxation. Loss of disc height noted L3-4 with mild loss of disc height L4-5. Facet osteoarthritis noted lower lumbar levels bilaterally. SI joints unremarkable. IMPRESSION: Degenerative changes without acute bony abnormality. Electronically Signed   By: Kennith Center M.D.   On: 01/14/2021 07:41   DG Hip Unilat W or Wo Pelvis 2-3 Views Left  Result Date: 01/14/2021 CLINICAL DATA:  Left posterior hip pain. EXAM: DG HIP (WITH OR WITHOUT PELVIS) 2-3V LEFT COMPARISON:  None. FINDINGS: There is no evidence of hip fracture or dislocation. There is no evidence of arthropathy or other focal bone abnormality. IMPRESSION: Negative. Electronically Signed   By: Kennith Center M.D.   On: 01/14/2021 07:41    Procedures Procedures   Medications Ordered in ED Medications - No data to display  ED Course  I have reviewed the triage vital signs and the nursing notes.  Pertinent labs & imaging results that were available during my care of the patient were reviewed by me and considered in my medical decision making (see chart for details).    MDM Rules/Calculators/A&P                          Patient with back pain for 1 week.  Significant amount of heavy lifting at his job. No neurological deficits and normal neuro exam.  Patient can walk without pain with normal gait. No loss of bowel or bladder control.  No concern for cauda equina.  No fever, night sweats, weight loss, h/o cancer, IVDU. Distal pulses intact and 2+. States pain is intermittent and feels like a muscle spasm. Will treat for same. Patient educated on side effects of this medication including some sedation, aware not to drive or operate heavy machinery when taking it. RICE protocol and pain medicine indicated and discussed with patient. He has an appointment with his PCP tomorrow for follow-up. Discharged in stable condition.   This is a shared visit with supervising physician Dr. Wilkie Aye who has independently evaluated patient & provided  guidance in evaluation/management/disposition, in agreement with care   Final Clinical Impression(s) / ED Diagnoses Final diagnoses:  Left hip pain    Rx / DC Orders ED Discharge Orders          Ordered    methocarbamol (ROBAXIN) 500 MG tablet  2 times daily        01/14/21 1440          An After Visit Summary was printed and given to the patient.    Silva Bandy, PA-C 01/14/21 1442    Rozelle Logan, DO 01/15/21 1018

## 2021-01-14 NOTE — Discharge Instructions (Signed)
Imaging of your hip today was negative for fracture or dislocation. Feel this pain is likely musculoskeletal in nature. As you have described the pain as a spasm, I have given you a prescription for a muscle relaxer. As we discussed, please take this at night and do no drive or operate heavy machinery after use as it does have sedating proprieties.   Return if development of new or worsening symptoms

## 2021-01-14 NOTE — ED Provider Notes (Signed)
Emergency Medicine Provider Triage Evaluation Note  Mark Gilbert , a 70 y.o. male  was evaluated in triage.  Pt complains of left lower extremity pain.  Pain begins at his left lateral hip, goes all the way down to his ankle.  No recent trauma or injury.  No bowel or bladder incontinence, saddle paresthesia.  No history of IV drug use, fever.  No known traumatic injury.  Review of Systems  Positive: Left hip, lower back pain Negative: Fever, IVDU, numbness, weakness  Physical Exam  There were no vitals taken for this visit. Gen:   Awake, no distress   Resp:  Normal effort  MSK:   Moves extremities without difficulty  Other:  Ambulatory   Medical Decision Making  Medically screening exam initiated at 6:55 AM.  Appropriate orders placed.  Sedalia Muta was informed that the remainder of the evaluation will be completed by another provider, this initial triage assessment does not replace that evaluation, and the importance of remaining in the ED until their evaluation is complete.    Low back, left hip pain    Delyla Sandeen A, PA-C 01/14/21 0656    Tilden Fossa, MD 01/14/21 [phone]

## 2021-01-15 DIAGNOSIS — M79605 Pain in left leg: Secondary | ICD-10-CM | POA: Diagnosis not present

## 2021-01-15 DIAGNOSIS — R972 Elevated prostate specific antigen [PSA]: Secondary | ICD-10-CM | POA: Diagnosis not present

## 2021-01-15 DIAGNOSIS — M5136 Other intervertebral disc degeneration, lumbar region: Secondary | ICD-10-CM | POA: Diagnosis not present

## 2021-02-25 DIAGNOSIS — R972 Elevated prostate specific antigen [PSA]: Secondary | ICD-10-CM | POA: Diagnosis not present

## 2021-03-04 DIAGNOSIS — N401 Enlarged prostate with lower urinary tract symptoms: Secondary | ICD-10-CM | POA: Diagnosis not present

## 2021-03-04 DIAGNOSIS — R3912 Poor urinary stream: Secondary | ICD-10-CM | POA: Diagnosis not present

## 2021-03-04 DIAGNOSIS — R972 Elevated prostate specific antigen [PSA]: Secondary | ICD-10-CM | POA: Diagnosis not present

## 2021-06-16 DIAGNOSIS — Z125 Encounter for screening for malignant neoplasm of prostate: Secondary | ICD-10-CM | POA: Diagnosis not present

## 2021-06-16 DIAGNOSIS — R7989 Other specified abnormal findings of blood chemistry: Secondary | ICD-10-CM | POA: Diagnosis not present

## 2021-06-16 DIAGNOSIS — Z79899 Other long term (current) drug therapy: Secondary | ICD-10-CM | POA: Diagnosis not present

## 2021-06-23 DIAGNOSIS — R0981 Nasal congestion: Secondary | ICD-10-CM | POA: Diagnosis not present

## 2021-06-23 DIAGNOSIS — Z0000 Encounter for general adult medical examination without abnormal findings: Secondary | ICD-10-CM | POA: Diagnosis not present

## 2021-06-23 DIAGNOSIS — M79604 Pain in right leg: Secondary | ICD-10-CM | POA: Diagnosis not present

## 2021-06-23 DIAGNOSIS — R6882 Decreased libido: Secondary | ICD-10-CM | POA: Diagnosis not present

## 2021-06-23 DIAGNOSIS — Z1339 Encounter for screening examination for other mental health and behavioral disorders: Secondary | ICD-10-CM | POA: Diagnosis not present

## 2021-06-23 DIAGNOSIS — Z1331 Encounter for screening for depression: Secondary | ICD-10-CM | POA: Diagnosis not present

## 2021-06-23 DIAGNOSIS — N401 Enlarged prostate with lower urinary tract symptoms: Secondary | ICD-10-CM | POA: Diagnosis not present

## 2021-08-30 DIAGNOSIS — R972 Elevated prostate specific antigen [PSA]: Secondary | ICD-10-CM | POA: Diagnosis not present

## 2021-09-06 DIAGNOSIS — R3912 Poor urinary stream: Secondary | ICD-10-CM | POA: Diagnosis not present

## 2021-09-06 DIAGNOSIS — N401 Enlarged prostate with lower urinary tract symptoms: Secondary | ICD-10-CM | POA: Diagnosis not present

## 2021-09-06 DIAGNOSIS — R972 Elevated prostate specific antigen [PSA]: Secondary | ICD-10-CM | POA: Diagnosis not present

## 2021-11-27 DIAGNOSIS — Z23 Encounter for immunization: Secondary | ICD-10-CM | POA: Diagnosis not present

## 2022-01-13 DIAGNOSIS — Z1152 Encounter for screening for COVID-19: Secondary | ICD-10-CM | POA: Diagnosis not present

## 2022-01-13 DIAGNOSIS — R638 Other symptoms and signs concerning food and fluid intake: Secondary | ICD-10-CM | POA: Diagnosis not present

## 2022-01-13 DIAGNOSIS — G43909 Migraine, unspecified, not intractable, without status migrainosus: Secondary | ICD-10-CM | POA: Diagnosis not present

## 2022-01-13 DIAGNOSIS — R5383 Other fatigue: Secondary | ICD-10-CM | POA: Diagnosis not present

## 2022-01-13 DIAGNOSIS — R051 Acute cough: Secondary | ICD-10-CM | POA: Diagnosis not present

## 2022-01-13 DIAGNOSIS — K529 Noninfective gastroenteritis and colitis, unspecified: Secondary | ICD-10-CM | POA: Diagnosis not present

## 2022-03-04 DIAGNOSIS — R972 Elevated prostate specific antigen [PSA]: Secondary | ICD-10-CM | POA: Diagnosis not present

## 2022-03-11 DIAGNOSIS — R972 Elevated prostate specific antigen [PSA]: Secondary | ICD-10-CM | POA: Diagnosis not present

## 2022-03-11 DIAGNOSIS — N401 Enlarged prostate with lower urinary tract symptoms: Secondary | ICD-10-CM | POA: Diagnosis not present

## 2022-03-11 DIAGNOSIS — R3912 Poor urinary stream: Secondary | ICD-10-CM | POA: Diagnosis not present

## 2022-07-08 DIAGNOSIS — Z79899 Other long term (current) drug therapy: Secondary | ICD-10-CM | POA: Diagnosis not present

## 2022-07-08 DIAGNOSIS — R5383 Other fatigue: Secondary | ICD-10-CM | POA: Diagnosis not present

## 2022-07-08 DIAGNOSIS — Z125 Encounter for screening for malignant neoplasm of prostate: Secondary | ICD-10-CM | POA: Diagnosis not present

## 2022-07-08 DIAGNOSIS — R7989 Other specified abnormal findings of blood chemistry: Secondary | ICD-10-CM | POA: Diagnosis not present

## 2022-07-15 DIAGNOSIS — Z1339 Encounter for screening examination for other mental health and behavioral disorders: Secondary | ICD-10-CM | POA: Diagnosis not present

## 2022-07-15 DIAGNOSIS — R6882 Decreased libido: Secondary | ICD-10-CM | POA: Diagnosis not present

## 2022-07-15 DIAGNOSIS — Z0000 Encounter for general adult medical examination without abnormal findings: Secondary | ICD-10-CM | POA: Diagnosis not present

## 2022-07-15 DIAGNOSIS — Z1331 Encounter for screening for depression: Secondary | ICD-10-CM | POA: Diagnosis not present

## 2022-07-15 DIAGNOSIS — N401 Enlarged prostate with lower urinary tract symptoms: Secondary | ICD-10-CM | POA: Diagnosis not present

## 2022-07-15 DIAGNOSIS — R059 Cough, unspecified: Secondary | ICD-10-CM | POA: Diagnosis not present

## 2022-09-09 DIAGNOSIS — R972 Elevated prostate specific antigen [PSA]: Secondary | ICD-10-CM | POA: Diagnosis not present

## 2022-10-25 DIAGNOSIS — R3912 Poor urinary stream: Secondary | ICD-10-CM | POA: Diagnosis not present

## 2022-10-25 DIAGNOSIS — R972 Elevated prostate specific antigen [PSA]: Secondary | ICD-10-CM | POA: Diagnosis not present

## 2022-10-25 DIAGNOSIS — N401 Enlarged prostate with lower urinary tract symptoms: Secondary | ICD-10-CM | POA: Diagnosis not present

## 2022-11-28 DIAGNOSIS — J209 Acute bronchitis, unspecified: Secondary | ICD-10-CM | POA: Diagnosis not present

## 2022-11-28 DIAGNOSIS — R051 Acute cough: Secondary | ICD-10-CM | POA: Diagnosis not present

## 2022-12-03 DIAGNOSIS — Z23 Encounter for immunization: Secondary | ICD-10-CM | POA: Diagnosis not present

## 2023-01-21 IMAGING — MR MR PROSTATE WO/W CM
12 series · 48 of 48 positions shown · IV contrast (multihance)
Comparison: None.

CLINICAL DATA: Elevated PSA.

EXAM:
MR PROSTATE WITHOUT AND WITH CONTRAST
TECHNIQUE: Multiplanar multisequence MRI images were obtained of the pelvis
centered about the prostate. Pre and post contrast images were
obtained.
CONTRAST:  10mL MULTIHANCE GADOBENATE DIMEGLUMINE 529 MG/ML IV SOLN

[Series 3: T2 · coronal · 3.0mm · 0.56mm/px · 1 of 25 slices shown (1 of 3)]
[im 1/25]
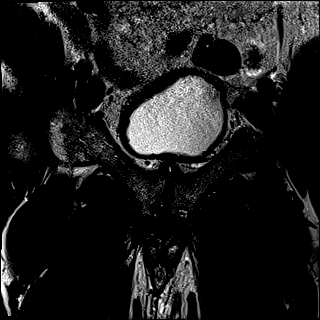

[Series 4: T1 · axial · 5.0mm · 1.25mm/px · z∈[-17,+218]mm · 2 of 96 slices shown]
[im 1/96]
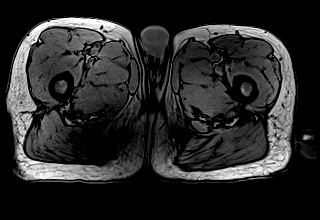
[im 96/96]
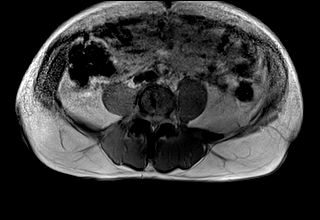

[Series 5: DWI · axial · 3.0mm · 1.75mm/px · 1 of 75 slices shown (1 of 3)]
[im 1/75]
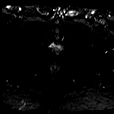

[Series 6: DWI · axial · 3.0mm · 1.75mm/px · 1 of 25 slices shown (2 of 3)]
[im 1/25]
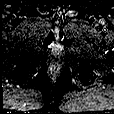

[Series 7: DWI · axial · 3.0mm · 1.75mm/px · 1 of 24 slices shown (3 of 3)]
[im 1/24]
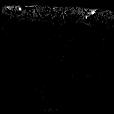

[Series 8: T2 · axial · 3.0mm · 0.56mm/px · 1 of 25 slices shown (2 of 3)]
[im 1/25]
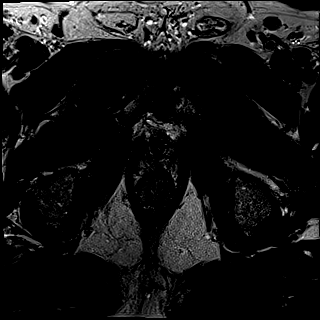

[Series 9: T2 · axial · 1.0mm · 1.04mm/px · 1 of 80 slices shown (3 of 3)]
[im 1/80]
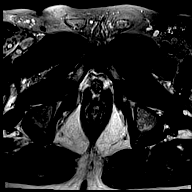

[Series 10: pre t1_twist_tra_dyn · axial · non-contrast · 3.5mm · 0.83mm/px · 1 of 20 slices shown]
[im 1/20]
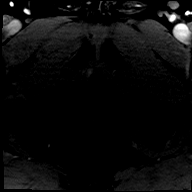

[Series 11: post t1_twist_tra_dyn-copy center · axial · non-contrast · 3.5mm · 0.83mm/px · z∈[+37,+103]mm · 17 of 600 slices shown]
[im 1/600]
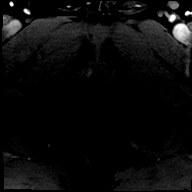
[im 38/600]
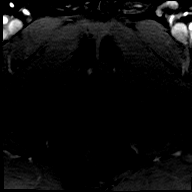
[im 75/600]
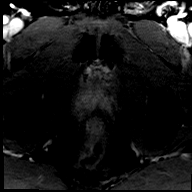
[im 113/600]
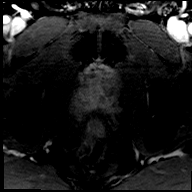
[im 150/600]
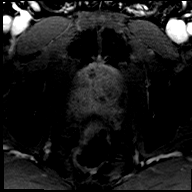
[im 188/600]
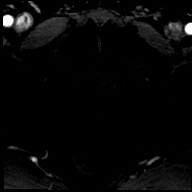
[im 225/600]
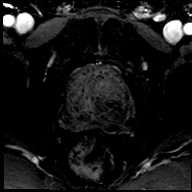
[im 263/600]
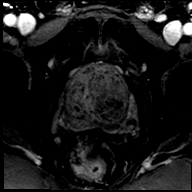
[im 300/600]
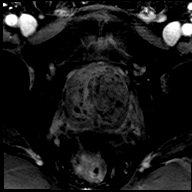
[im 337/600]
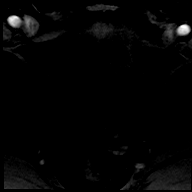
[im 375/600]
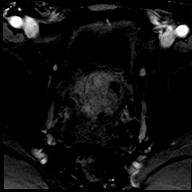
[im 412/600]
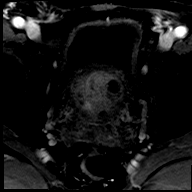
[im 450/600]
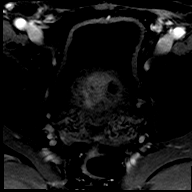
[im 487/600]
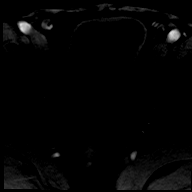
[im 525/600]
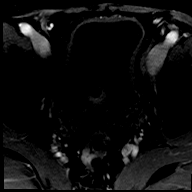
[im 562/600]
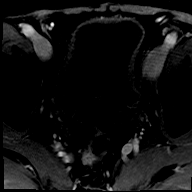
[im 600/600]
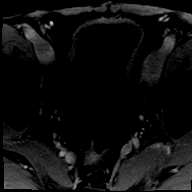

[Series 12: post t1_twist_tra_dyn-copy cent_sub · axial · 3.5mm · 0.83mm/px · z∈[+37,+103]mm · 16 of 580 slices shown]
[im 1/580]
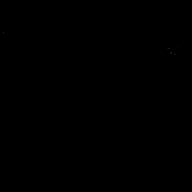
[im 39/580]
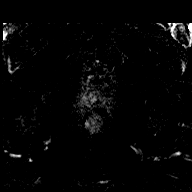
[im 78/580]
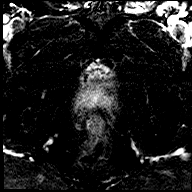
[im 116/580]
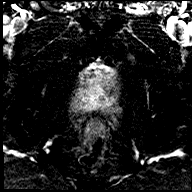
[im 155/580]
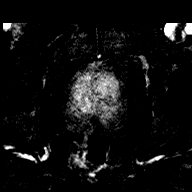
[im 194/580]
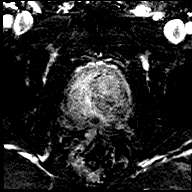
[im 232/580]
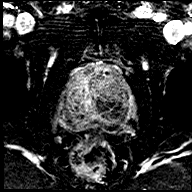
[im 271/580]
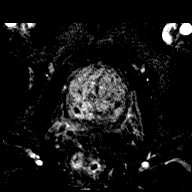
[im 309/580]
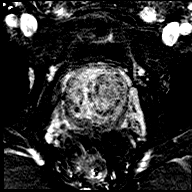
[im 348/580]
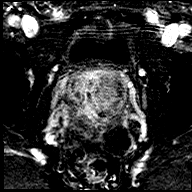
[im 387/580]
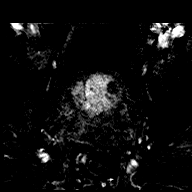
[im 425/580]
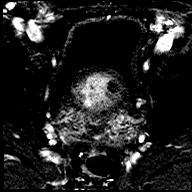
[im 464/580]
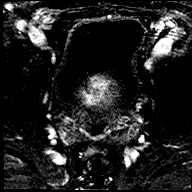
[im 502/580]
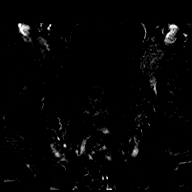
[im 541/580]
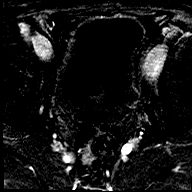
[im 580/580]
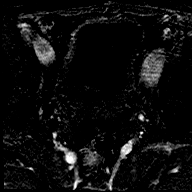

[Series 13: t1_vibe_dixon_tra_f · axial · 2.5mm · 0.91mm/px · z∈[-20,+218]mm · 3 of 96 slices shown]
[im 1/96]
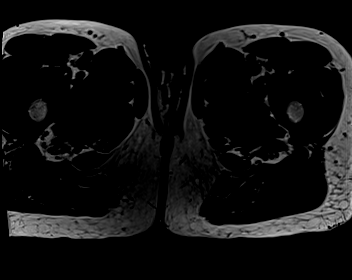
[im 48/96]
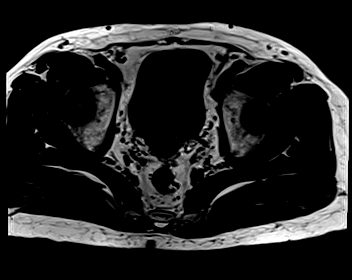
[im 96/96]
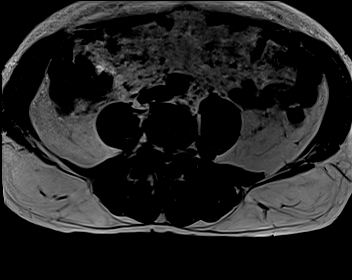

[Series 14: t1_vibe_dixon_tra_w · axial · 2.5mm · 0.91mm/px · z∈[-20,+218]mm · 3 of 96 slices shown]
[im 1/96]
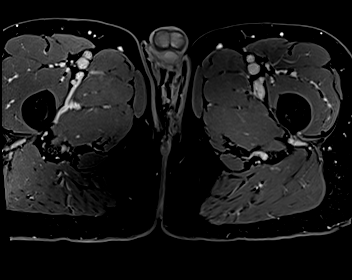
[im 48/96]
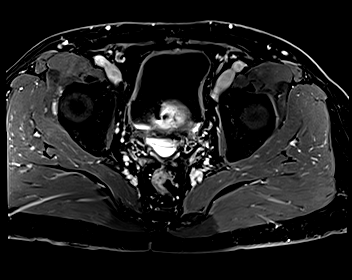
[im 96/96]
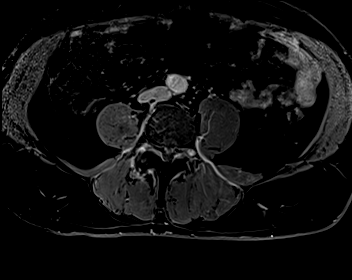

[48 of 48 positions shown; findings below may reference images not displayed]

FINDINGS: Prostate:

-- Peripheral Zone: 8 x 5 mm T2 hypointense nodule is seen in the
right posterolateral apex on image 63/9. This shows marked ADC
hypointensity, but no significant DWI hyperintensity or early focal
contrast enhancement.

-- Transition/Central Zone: Moderately enlarged with multiple
circumscribed BPH nodules. A heterogeneous T2 hypointense nodule
with obscured margins is seen in the left anterior mid gland,
measuring 2.6 x 1.8 cm on image 47/9. This shows marked ADC
hypointensity and mild DWI hyperintensity.

A heterogeneous T2 hypointense nodule with obscured margins is also
seen in the left posterior mid gland, measuring 1.9 x 1.6 cm on
image [DATE]. This also shows marked ADC hypointensity and mild DWI
hyperintensity.

-- Measurements/Volume:  6.3 x 5.4 x 5.4 cm (volume = 96 cm^3)

Transcapsular spread:  Absent

Seminal vesicle involvement:  Absent

Neurovascular bundle involvement:  Absent

Pelvic adenopathy: None visualized

Bone metastasis: None visualized

Other: Mild diffuse bladder wall thickening, consistent with chronic
bladder outlet obstruction. Sigmoid diverticulosis, without evidence
of diverticulitis.
IMPRESSION: 8 mm peripheral zone nodule in the right posterolateral apex, which
is indeterminate. PI-RADS 3 (v2.1): Intermediate (clinically
significant cancer equivocal)

2.6 cm transition zone nodule in the left anterior mid gland, which
is indeterminate. PI-RADS 3 (v2.1): Intermediate (clinically
significant cancer equivocal)

1.9 cm transition zone nodule in the left posterior mid gland, also
indeterminate. PI-RADS 3 (v2.1): Intermediate (clinically
significant cancer equivocal)

No evidence of extracapsular extension or pelvic metastatic disease.

(I have post-processed this exam in the [employer] application for
potential MR-US fusion-guided biopsy.)

## 2023-05-12 DIAGNOSIS — R0981 Nasal congestion: Secondary | ICD-10-CM | POA: Diagnosis not present

## 2023-05-12 DIAGNOSIS — M25512 Pain in left shoulder: Secondary | ICD-10-CM | POA: Diagnosis not present

## 2023-07-28 DIAGNOSIS — R7989 Other specified abnormal findings of blood chemistry: Secondary | ICD-10-CM | POA: Diagnosis not present

## 2023-07-28 DIAGNOSIS — Z79899 Other long term (current) drug therapy: Secondary | ICD-10-CM | POA: Diagnosis not present

## 2023-07-28 DIAGNOSIS — N401 Enlarged prostate with lower urinary tract symptoms: Secondary | ICD-10-CM | POA: Diagnosis not present

## 2023-08-04 DIAGNOSIS — K029 Dental caries, unspecified: Secondary | ICD-10-CM | POA: Diagnosis not present

## 2023-08-04 DIAGNOSIS — R059 Cough, unspecified: Secondary | ICD-10-CM | POA: Diagnosis not present

## 2023-08-04 DIAGNOSIS — Z1339 Encounter for screening examination for other mental health and behavioral disorders: Secondary | ICD-10-CM | POA: Diagnosis not present

## 2023-08-04 DIAGNOSIS — Z0000 Encounter for general adult medical examination without abnormal findings: Secondary | ICD-10-CM | POA: Diagnosis not present

## 2023-08-04 DIAGNOSIS — N401 Enlarged prostate with lower urinary tract symptoms: Secondary | ICD-10-CM | POA: Diagnosis not present

## 2023-08-04 DIAGNOSIS — R0981 Nasal congestion: Secondary | ICD-10-CM | POA: Diagnosis not present

## 2023-08-04 DIAGNOSIS — Z1331 Encounter for screening for depression: Secondary | ICD-10-CM | POA: Diagnosis not present

## 2023-10-30 DIAGNOSIS — R972 Elevated prostate specific antigen [PSA]: Secondary | ICD-10-CM | POA: Diagnosis not present

## 2023-11-06 DIAGNOSIS — R3912 Poor urinary stream: Secondary | ICD-10-CM | POA: Diagnosis not present

## 2023-11-06 DIAGNOSIS — N401 Enlarged prostate with lower urinary tract symptoms: Secondary | ICD-10-CM | POA: Diagnosis not present

## 2023-11-06 DIAGNOSIS — R972 Elevated prostate specific antigen [PSA]: Secondary | ICD-10-CM | POA: Diagnosis not present

## 2023-11-08 ENCOUNTER — Other Ambulatory Visit: Payer: Self-pay | Admitting: Urology

## 2023-11-08 ENCOUNTER — Encounter: Payer: Self-pay | Admitting: Urology

## 2023-11-08 DIAGNOSIS — R972 Elevated prostate specific antigen [PSA]: Secondary | ICD-10-CM

## 2023-12-02 DIAGNOSIS — Z23 Encounter for immunization: Secondary | ICD-10-CM | POA: Diagnosis not present

## 2024-01-03 ENCOUNTER — Other Ambulatory Visit

## 2024-02-03 ENCOUNTER — Other Ambulatory Visit

## 2024-03-09 ENCOUNTER — Other Ambulatory Visit

## 2024-04-06 ENCOUNTER — Other Ambulatory Visit
